# Patient Record
Sex: Male | Born: 1970 | Race: White | Hispanic: No | Marital: Married | Smoking: Current every day smoker
Health system: Southern US, Community
[De-identification: ages and names within clinical notes are randomized; demographics above are authoritative.]

## PROBLEM LIST (undated history)

## (undated) DIAGNOSIS — T8859XA Other complications of anesthesia, initial encounter: Secondary | ICD-10-CM

## (undated) DIAGNOSIS — T4145XA Adverse effect of unspecified anesthetic, initial encounter: Secondary | ICD-10-CM

## (undated) DIAGNOSIS — Z8719 Personal history of other diseases of the digestive system: Secondary | ICD-10-CM

## (undated) HISTORY — PX: ELBOW SURGERY: SHX618

## (undated) HISTORY — PX: SHOULDER SURGERY: SHX246

---

## 2014-09-07 ENCOUNTER — Other Ambulatory Visit: Payer: Self-pay | Admitting: Orthopedic Surgery

## 2014-09-14 NOTE — Pre-Procedure Instructions (Signed)
Shon HoughWilliam Durango  09/14/2014   Your procedure is scheduled on:  Thursday September 22, 2014 at 7:30 AM.  [facility] at 5:30 AM.  Call this number if you have problems the morning of surgery: [phone]   Remember:   Do not eat food or drink liquids after midnight.   Take these medicines the morning of surgery with A SIP OF WATER: NONE   Please stop taking any vitamins, Meloxicam, Ibuprofen, Advil, Motrin, Alleve, etc    Do not wear jewelry.  Do not wear lotions, powders, or cologne.   Men may shave face and neck.  Do not bring valuables to the hospital.  [facility] not responsible for any belongings or valuables.               Contacts, dentures or bridgework may not be worn into surgery.  Leave suitcase in the car. After surgery it may be brought to your room.  For patients admitted to the hospital, discharge time is determined by your treatment team.               Patients discharged the day of surgery will not be allowed to drive home.  Name and phone number of your driver:   Special Instructions: Shower using CHG soap the night before and the morning of your surgery   Please read over the following fact sheets that you were given: Pain Booklet, Coughing and Deep Breathing, Blood Transfusion Information, MRSA Information and Surgical Site Infection Prevention

## 2014-09-15 ENCOUNTER — Encounter (HOSPITAL_COMMUNITY)
Admission: RE | Admit: 2014-09-15 | Discharge: 2014-09-15 | Disposition: A | Discharge status: Home / Self Care | Payer: Worker's Compensation | Source: Ambulatory Visit | Attending: Orthopedic Surgery | Admitting: Orthopedic Surgery

## 2014-09-15 ENCOUNTER — Encounter (HOSPITAL_COMMUNITY): Payer: Self-pay

## 2014-09-15 ENCOUNTER — Ambulatory Visit (HOSPITAL_COMMUNITY)
Admission: RE | Admit: 2014-09-15 | Discharge: 2014-09-15 | Disposition: A | Discharge status: Home / Self Care | Payer: Worker's Compensation | Source: Ambulatory Visit | Attending: Orthopedic Surgery | Admitting: Orthopedic Surgery

## 2014-09-15 DIAGNOSIS — Z01818 Encounter for other preprocedural examination: Secondary | ICD-10-CM | POA: Insufficient documentation

## 2014-09-15 DIAGNOSIS — Z87891 Personal history of nicotine dependence: Secondary | ICD-10-CM | POA: Insufficient documentation

## 2014-09-15 DIAGNOSIS — Z01810 Encounter for preprocedural cardiovascular examination: Secondary | ICD-10-CM | POA: Diagnosis not present

## 2014-09-15 DIAGNOSIS — Z01812 Encounter for preprocedural laboratory examination: Secondary | ICD-10-CM | POA: Diagnosis present

## 2014-09-15 HISTORY — DX: Personal history of other diseases of the digestive system: Z87.19

## 2014-09-15 HISTORY — DX: Adverse effect of unspecified anesthetic, initial encounter: T41.45XA

## 2014-09-15 HISTORY — DX: Other complications of anesthesia, initial encounter: T88.59XA

## 2014-09-15 LAB — CBC WITH DIFFERENTIAL/PLATELET
BASOS ABS: 0 10*3/uL (ref 0.0–0.1)
BASOS PCT: 0 % (ref 0–1)
Eosinophils Absolute: 0.4 10*3/uL (ref 0.0–0.7)
Eosinophils Relative: 5 % (ref 0–5)
HCT: 45.8 % (ref 39.0–52.0)
Hemoglobin: 15.6 g/dL (ref 13.0–17.0)
Lymphocytes Relative: 25 % (ref 12–46)
Lymphs Abs: 1.7 10*3/uL (ref 0.7–4.0)
MCH: 32.5 pg (ref 26.0–34.0)
MCHC: 34.1 g/dL (ref 30.0–36.0)
MCV: 95.4 fL (ref 78.0–100.0)
Monocytes Absolute: 0.5 10*3/uL (ref 0.1–1.0)
Monocytes Relative: 8 % (ref 3–12)
NEUTROS PCT: 62 % (ref 43–77)
Neutro Abs: 4.2 10*3/uL (ref 1.7–7.7)
PLATELETS: 234 10*3/uL (ref 150–400)
RBC: 4.8 MIL/uL (ref 4.22–5.81)
RDW: 13.6 % (ref 11.5–15.5)
WBC: 6.7 10*3/uL (ref 4.0–10.5)

## 2014-09-15 LAB — ABO/RH: ABO/RH(D): A POS

## 2014-09-15 LAB — URINALYSIS, ROUTINE W REFLEX MICROSCOPIC
Bilirubin Urine: NEGATIVE
Glucose, UA: NEGATIVE mg/dL
Hgb urine dipstick: NEGATIVE
Ketones, ur: NEGATIVE mg/dL
LEUKOCYTES UA: NEGATIVE
Nitrite: NEGATIVE
Protein, ur: NEGATIVE mg/dL
Specific Gravity, Urine: 1.011 (ref 1.005–1.030)
UROBILINOGEN UA: 0.2 mg/dL (ref 0.0–1.0)
pH: 7.5 (ref 5.0–8.0)

## 2014-09-15 LAB — COMPREHENSIVE METABOLIC PANEL
ALT: 31 U/L (ref 0–53)
AST: 25 U/L (ref 0–37)
Albumin: 3.9 g/dL (ref 3.5–5.2)
Alkaline Phosphatase: 64 U/L (ref 39–117)
Anion gap: 9 (ref 5–15)
BUN: 11 mg/dL (ref 6–23)
CO2: 24 mmol/L (ref 19–32)
CREATININE: 0.79 mg/dL (ref 0.50–1.35)
Calcium: 9.3 mg/dL (ref 8.4–10.5)
Chloride: 104 mmol/L (ref 96–112)
Glucose, Bld: 117 mg/dL — ABNORMAL HIGH (ref 70–99)
Potassium: 4.4 mmol/L (ref 3.5–5.1)
Sodium: 137 mmol/L (ref 135–145)
TOTAL PROTEIN: 7.1 g/dL (ref 6.0–8.3)
Total Bilirubin: 0.8 mg/dL (ref 0.3–1.2)

## 2014-09-15 LAB — SURGICAL PCR SCREEN
MRSA, PCR: NEGATIVE
STAPHYLOCOCCUS AUREUS: NEGATIVE

## 2014-09-15 LAB — PROTIME-INR
INR: 0.94 (ref 0.00–1.49)
PROTHROMBIN TIME: 12.7 s (ref 11.6–15.2)

## 2014-09-15 LAB — APTT: APTT: 28 s (ref 24–37)

## 2014-09-15 LAB — TYPE AND SCREEN
ABO/RH(D): A POS
Antibody Screen: NEGATIVE

## 2014-09-15 NOTE — Progress Notes (Signed)
Patient informed Nurse that he did not have a PCP. Patient denied having any cardiac or pulmonary issues.   While reviewing anesthesia history patient informed Nurse that [place] Patient unaware of the name of place where those procedures were performed, but he gave Nurse the address "[address] Patient stated after he had the shoulder surgery he woke up very "angry" and it lasted for about two or three days. Will attempt to request records from [facility] [place].

## 2014-09-21 MED ORDER — POVIDONE-IODINE 7.5 % EX SOLN
Freq: Once | CUTANEOUS | Status: DC
  Filled 2014-09-21: qty 118

## 2014-09-21 MED ORDER — CEFAZOLIN SODIUM-DEXTROSE 2-3 GM-% IV SOLR
2.0000 g | INTRAVENOUS | Status: AC
  Administered 2014-09-22 (×2): 2 g via INTRAVENOUS
  Filled 2014-09-21: qty 50

## 2014-09-22 ENCOUNTER — Inpatient Hospital Stay (HOSPITAL_COMMUNITY): Payer: Worker's Compensation | Admitting: Certified Registered Nurse Anesthetist

## 2014-09-22 ENCOUNTER — Inpatient Hospital Stay (HOSPITAL_COMMUNITY)
Admission: RE | Admit: 2014-09-22 | Discharge: 2014-09-24 | DRG: 460 | Disposition: A | Discharge status: Home / Self Care | Payer: Worker's Compensation | Source: Ambulatory Visit | Attending: Orthopedic Surgery | Admitting: Orthopedic Surgery

## 2014-09-22 ENCOUNTER — Inpatient Hospital Stay (HOSPITAL_COMMUNITY): Payer: Worker's Compensation

## 2014-09-22 ENCOUNTER — Encounter (HOSPITAL_COMMUNITY)
Admission: RE | Disposition: A | Discharge status: Home / Self Care | Payer: Worker's Compensation | Source: Ambulatory Visit | Attending: Orthopedic Surgery

## 2014-09-22 ENCOUNTER — Other Ambulatory Visit: Payer: Self-pay

## 2014-09-22 ENCOUNTER — Encounter (HOSPITAL_COMMUNITY): Payer: Self-pay | Admitting: Surgery

## 2014-09-22 DIAGNOSIS — F17210 Nicotine dependence, cigarettes, uncomplicated: Secondary | ICD-10-CM | POA: Diagnosis not present

## 2014-09-22 DIAGNOSIS — M5116 Intervertebral disc disorders with radiculopathy, lumbar region: Principal | ICD-10-CM | POA: Diagnosis present

## 2014-09-22 DIAGNOSIS — M4806 Spinal stenosis, lumbar region: Secondary | ICD-10-CM | POA: Diagnosis present

## 2014-09-22 DIAGNOSIS — M5410 Radiculopathy, site unspecified: Secondary | ICD-10-CM | POA: Diagnosis present

## 2014-09-22 DIAGNOSIS — Z419 Encounter for procedure for purposes other than remedying health state, unspecified: Secondary | ICD-10-CM

## 2014-09-22 DIAGNOSIS — M549 Dorsalgia, unspecified: Secondary | ICD-10-CM | POA: Diagnosis present

## 2014-09-22 SURGERY — POSTERIOR LUMBAR FUSION 2 LEVEL
Anesthesia: General | Laterality: Left

## 2014-09-22 MED ORDER — LIDOCAINE HCL (CARDIAC) 20 MG/ML IV SOLN
INTRAVENOUS | Status: AC
  Filled 2014-09-22: qty 5

## 2014-09-22 MED ORDER — OXYCODONE-ACETAMINOPHEN 5-325 MG PO TABS
1.0000 | ORAL_TABLET | ORAL | Status: DC | PRN
  Administered 2014-09-22 – 2014-09-24 (×7): 2 via ORAL
  Filled 2014-09-22 (×7): qty 2

## 2014-09-22 MED ORDER — OXYCODONE HCL 5 MG PO TABS: 5.0000 mg | ORAL_TABLET | Freq: Once | ORAL | Status: DC | PRN

## 2014-09-22 MED ORDER — DIAZEPAM 5 MG PO TABS
5.0000 mg | ORAL_TABLET | Freq: Four times a day (QID) | ORAL | Status: DC | PRN
  Administered 2014-09-22 – 2014-09-24 (×5): 5 mg via ORAL
  Filled 2014-09-22 (×5): qty 1

## 2014-09-22 MED ORDER — BUPIVACAINE-EPINEPHRINE (PF) 0.25% -1:200000 IJ SOLN
INTRAMUSCULAR | Status: AC
  Filled 2014-09-22: qty 30

## 2014-09-22 MED ORDER — ACETAMINOPHEN 325 MG PO TABS: 650.0000 mg | ORAL_TABLET | ORAL | Status: DC | PRN

## 2014-09-22 MED ORDER — VECURONIUM BROMIDE 10 MG IV SOLR
INTRAVENOUS | Status: DC | PRN
  Administered 2014-09-22 (×3): 1 mg via INTRAVENOUS
  Administered 2014-09-22: 2 mg via INTRAVENOUS

## 2014-09-22 MED ORDER — GLYCOPYRROLATE 0.2 MG/ML IJ SOLN
INTRAMUSCULAR | Status: DC | PRN
  Administered 2014-09-22: .8 mg via INTRAVENOUS

## 2014-09-22 MED ORDER — PROPOFOL INFUSION 10 MG/ML OPTIME
INTRAVENOUS | Status: DC | PRN
  Administered 2014-09-22: 50 ug/kg/min via INTRAVENOUS

## 2014-09-22 MED ORDER — FENTANYL CITRATE 0.05 MG/ML IJ SOLN
INTRAMUSCULAR | Status: AC
  Filled 2014-09-22: qty 5

## 2014-09-22 MED ORDER — MIDAZOLAM HCL 2 MG/2ML IJ SOLN
INTRAMUSCULAR | Status: AC
  Filled 2014-09-22: qty 2

## 2014-09-22 MED ORDER — METHYLENE BLUE 1 % INJ SOLN
INTRAMUSCULAR | Status: AC
  Filled 2014-09-22: qty 10

## 2014-09-22 MED ORDER — SENNOSIDES-DOCUSATE SODIUM 8.6-50 MG PO TABS: 1.0000 | ORAL_TABLET | Freq: Every evening | ORAL | Status: DC | PRN

## 2014-09-22 MED ORDER — MORPHINE SULFATE 2 MG/ML IJ SOLN
1.0000 mg | INTRAMUSCULAR | Status: DC | PRN
  Administered 2014-09-22: 2 mg via INTRAVENOUS
  Administered 2014-09-23 (×3): 4 mg via INTRAVENOUS
  Filled 2014-09-22 (×3): qty 2
  Filled 2014-09-22: qty 1

## 2014-09-22 MED ORDER — ROCURONIUM BROMIDE 100 MG/10ML IV SOLN
INTRAVENOUS | Status: DC | PRN
  Administered 2014-09-22: 30 mg via INTRAVENOUS

## 2014-09-22 MED ORDER — SUCCINYLCHOLINE CHLORIDE 20 MG/ML IJ SOLN
INTRAMUSCULAR | Status: DC | PRN
  Administered 2014-09-22: 140 mg via INTRAVENOUS

## 2014-09-22 MED ORDER — MIDAZOLAM HCL 5 MG/5ML IJ SOLN
INTRAMUSCULAR | Status: DC | PRN
  Administered 2014-09-22: 2 mg via INTRAVENOUS

## 2014-09-22 MED ORDER — 0.9 % SODIUM CHLORIDE (POUR BTL) OPTIME
TOPICAL | Status: DC | PRN
  Administered 2014-09-22 (×3): 1000 mL

## 2014-09-22 MED ORDER — ONDANSETRON HCL 4 MG/2ML IJ SOLN
INTRAMUSCULAR | Status: DC | PRN
  Administered 2014-09-22: 4 mg via INTRAVENOUS

## 2014-09-22 MED ORDER — SODIUM CHLORIDE 0.9 % IV SOLN
INTRAVENOUS | Status: DC
  Administered 2014-09-22: 75 mL/h via INTRAVENOUS
  Administered 2014-09-23: 04:00:00 via INTRAVENOUS

## 2014-09-22 MED ORDER — SODIUM CHLORIDE 0.9 % IJ SOLN
INTRAMUSCULAR | Status: AC
  Filled 2014-09-22: qty 3

## 2014-09-22 MED ORDER — DOCUSATE SODIUM 100 MG PO CAPS
100.0000 mg | ORAL_CAPSULE | Freq: Two times a day (BID) | ORAL | Status: DC
  Administered 2014-09-22 – 2014-09-23 (×3): 100 mg via ORAL
  Filled 2014-09-22 (×2): qty 1

## 2014-09-22 MED ORDER — BISACODYL 5 MG PO TBEC: 5.0000 mg | DELAYED_RELEASE_TABLET | Freq: Every day | ORAL | Status: DC | PRN

## 2014-09-22 MED ORDER — METHYLPREDNISOLONE ACETATE 40 MG/ML IJ SUSP
INTRAMUSCULAR | Status: AC
  Filled 2014-09-22: qty 2

## 2014-09-22 MED ORDER — SODIUM CHLORIDE 0.9 % IJ SOLN
3.0000 mL | Freq: Two times a day (BID) | INTRAMUSCULAR | Status: DC
  Administered 2014-09-22 – 2014-09-23 (×2): 3 mL via INTRAVENOUS

## 2014-09-22 MED ORDER — LIDOCAINE HCL (CARDIAC) 20 MG/ML IV SOLN
INTRAVENOUS | Status: DC | PRN
  Administered 2014-09-22: 40 mg via INTRAVENOUS

## 2014-09-22 MED ORDER — MENTHOL 3 MG MT LOZG: 1.0000 | LOZENGE | OROMUCOSAL | Status: DC | PRN

## 2014-09-22 MED ORDER — PHENOL 1.4 % MT LIQD
1.0000 | OROMUCOSAL | Status: DC | PRN
Start: 2014-09-22 — End: 2014-09-24

## 2014-09-22 MED ORDER — THROMBIN 20000 UNITS EX SOLR
CUTANEOUS | Status: AC
  Filled 2014-09-22: qty 20000

## 2014-09-22 MED ORDER — PROPOFOL 10 MG/ML IV BOLUS
INTRAVENOUS | Status: AC
  Filled 2014-09-22: qty 20

## 2014-09-22 MED ORDER — SODIUM CHLORIDE 0.9 % IV SOLN
250.0000 mL | INTRAVENOUS | Status: DC
  Administered 2014-09-22: 250 mL via INTRAVENOUS

## 2014-09-22 MED ORDER — ONDANSETRON HCL 4 MG/2ML IJ SOLN
INTRAMUSCULAR | Status: AC
  Filled 2014-09-22: qty 2

## 2014-09-22 MED ORDER — CEFAZOLIN SODIUM 1-5 GM-% IV SOLN
1.0000 g | Freq: Three times a day (TID) | INTRAVENOUS | Status: AC
  Administered 2014-09-22 – 2014-09-23 (×2): 1 g via INTRAVENOUS
  Filled 2014-09-22 (×2): qty 50

## 2014-09-22 MED ORDER — ACETAMINOPHEN 650 MG RE SUPP: 650.0000 mg | RECTAL | Status: DC | PRN

## 2014-09-22 MED ORDER — ARTIFICIAL TEARS OP OINT
TOPICAL_OINTMENT | OPHTHALMIC | Status: DC | PRN
Start: 2014-09-22 — End: 2014-09-22
  Administered 2014-09-22: 1 via OPHTHALMIC

## 2014-09-22 MED ORDER — ZOLPIDEM TARTRATE 5 MG PO TABS: 5.0000 mg | ORAL_TABLET | Freq: Every evening | ORAL | Status: DC | PRN

## 2014-09-22 MED ORDER — METHYLENE BLUE 1 % INJ SOLN
INTRAMUSCULAR | Status: DC | PRN
  Administered 2014-09-22: .5 mL

## 2014-09-22 MED ORDER — SODIUM CHLORIDE 0.9 % IJ SOLN: 3.0000 mL | INTRAMUSCULAR | Status: DC | PRN

## 2014-09-22 MED ORDER — LACTATED RINGERS IV SOLN
INTRAVENOUS | Status: DC | PRN
  Administered 2014-09-22 (×3): via INTRAVENOUS

## 2014-09-22 MED ORDER — ONDANSETRON HCL 4 MG/2ML IJ SOLN
4.0000 mg | Freq: Once | INTRAMUSCULAR | Status: DC | PRN
Start: 2014-09-22 — End: 2014-09-22

## 2014-09-22 MED ORDER — OXYCODONE HCL 5 MG/5ML PO SOLN: 5.0000 mg | Freq: Once | ORAL | Status: DC | PRN

## 2014-09-22 MED ORDER — ONDANSETRON HCL 4 MG/2ML IJ SOLN: 4.0000 mg | INTRAMUSCULAR | Status: DC | PRN

## 2014-09-22 MED ORDER — BUPIVACAINE-EPINEPHRINE 0.25% -1:200000 IJ SOLN
INTRAMUSCULAR | Status: DC | PRN
  Administered 2014-09-22: 26 mL

## 2014-09-22 MED ORDER — FLEET ENEMA 7-19 GM/118ML RE ENEM: 1.0000 | ENEMA | Freq: Once | RECTAL | Status: AC | PRN

## 2014-09-22 MED ORDER — LIDOCAINE HCL 4 % MT SOLN
OROMUCOSAL | Status: DC | PRN
  Administered 2014-09-22: 4 mL via TOPICAL

## 2014-09-22 MED ORDER — ALUM & MAG HYDROXIDE-SIMETH 200-200-20 MG/5ML PO SUSP: 30.0000 mL | Freq: Four times a day (QID) | ORAL | Status: DC | PRN

## 2014-09-22 MED ORDER — GELATIN ABSORBABLE 100 EX MISC
CUTANEOUS | Status: DC | PRN
  Administered 2014-09-22: 20 mL via TOPICAL

## 2014-09-22 MED ORDER — HYDROMORPHONE HCL 1 MG/ML IJ SOLN: 0.2500 mg | INTRAMUSCULAR | Status: DC | PRN

## 2014-09-22 MED ORDER — KETOROLAC TROMETHAMINE 0.5 % OP SOLN
1.0000 [drp] | Freq: Four times a day (QID) | OPHTHALMIC | Status: DC
Start: 2014-09-22 — End: 2014-09-24
  Administered 2014-09-22 – 2014-09-23 (×7): 1 [drp] via OPHTHALMIC
  Filled 2014-09-22: qty 3

## 2014-09-22 MED ORDER — FENTANYL CITRATE 0.05 MG/ML IJ SOLN
INTRAMUSCULAR | Status: DC | PRN
  Administered 2014-09-22 (×3): 50 ug via INTRAVENOUS
  Administered 2014-09-22: 100 ug via INTRAVENOUS
  Administered 2014-09-22 (×7): 50 ug via INTRAVENOUS

## 2014-09-22 MED ORDER — NEOSTIGMINE METHYLSULFATE 10 MG/10ML IV SOLN
INTRAVENOUS | Status: DC | PRN
  Administered 2014-09-22: 5 mg via INTRAVENOUS

## 2014-09-22 MED ORDER — PROPOFOL 10 MG/ML IV BOLUS
INTRAVENOUS | Status: DC | PRN
  Administered 2014-09-22: 250 mg via INTRAVENOUS

## 2014-09-22 SURGICAL SUPPLY — 86 items
BENZOIN TINCTURE PRP APPL 2/3 (GAUZE/BANDAGES/DRESSINGS) ×2 IMPLANT
BLADE SURG ROTATE 9660 (MISCELLANEOUS) IMPLANT
BUR PRESCISION 1.7 ELITE (BURR) ×2 IMPLANT
BUR ROUND PRECISION 4.0 (BURR) ×2 IMPLANT
CAGE BULLET CONCORDE 9X9X27 (Cage) ×2 IMPLANT
CAGE CONCORDE BULLET 9X7X27 (Cage) ×2 IMPLANT
CARTRIDGE OIL MAESTRO DRILL (MISCELLANEOUS) ×1 IMPLANT
CONT SPEC STER OR (MISCELLANEOUS) IMPLANT
COVER SURGICAL LIGHT HANDLE (MISCELLANEOUS) ×2 IMPLANT
DIFFUSER DRILL AIR PNEUMATIC (MISCELLANEOUS) ×2 IMPLANT
DRAIN CHANNEL 15F RND FF W/TCR (WOUND CARE) IMPLANT
DRAPE C-ARM 42X72 X-RAY (DRAPES) ×2 IMPLANT
DRAPE C-ARMOR (DRAPES) ×2 IMPLANT
DRAPE ORTHO SPLIT 77X108 STRL (DRAPES)
DRAPE POUCH INSTRU U-SHP 10X18 (DRAPES) IMPLANT
DRAPE SURG 17X23 STRL (DRAPES) ×8 IMPLANT
DRAPE SURG ORHT 6 SPLT 77X108 (DRAPES) IMPLANT
DRSG MEPILEX BORDER 4X12 (GAUZE/BANDAGES/DRESSINGS) IMPLANT
DRSG MEPILEX BORDER 4X8 (GAUZE/BANDAGES/DRESSINGS) IMPLANT
DURAPREP 26ML APPLICATOR (WOUND CARE) ×2 IMPLANT
ELECT BLADE 4.0 EZ CLEAN MEGAD (MISCELLANEOUS) ×2
ELECT CAUTERY BLADE 6.4 (BLADE) ×4 IMPLANT
ELECT REM PT RETURN 9FT ADLT (ELECTROSURGICAL) ×2
ELECTRODE BLDE 4.0 EZ CLN MEGD (MISCELLANEOUS) ×1 IMPLANT
ELECTRODE REM PT RTRN 9FT ADLT (ELECTROSURGICAL) ×1 IMPLANT
EVACUATOR SILICONE 100CC (DRAIN) IMPLANT
GAUZE SPONGE 4X4 12PLY STRL (GAUZE/BANDAGES/DRESSINGS) ×2 IMPLANT
GAUZE SPONGE 4X4 16PLY XRAY LF (GAUZE/BANDAGES/DRESSINGS) ×2 IMPLANT
GLOVE BIO SURGEON STRL SZ7 (GLOVE) ×2 IMPLANT
GLOVE BIO SURGEON STRL SZ8 (GLOVE) ×2 IMPLANT
GLOVE BIOGEL PI IND STRL 7.0 (GLOVE) ×2 IMPLANT
GLOVE BIOGEL PI IND STRL 8 (GLOVE) ×1 IMPLANT
GLOVE BIOGEL PI INDICATOR 7.0 (GLOVE) ×2
GLOVE BIOGEL PI INDICATOR 8 (GLOVE) ×1
GLOVE SURG SS PI 7.0 STRL IVOR (GLOVE) ×4 IMPLANT
GOWN STRL REUS W/ TWL LRG LVL3 (GOWN DISPOSABLE) ×3 IMPLANT
GOWN STRL REUS W/ TWL XL LVL3 (GOWN DISPOSABLE) ×1 IMPLANT
GOWN STRL REUS W/TWL LRG LVL3 (GOWN DISPOSABLE) ×3
GOWN STRL REUS W/TWL XL LVL3 (GOWN DISPOSABLE) ×1
IV CATH 14GX2 1/4 (CATHETERS) ×2 IMPLANT
KIT BASIN OR (CUSTOM PROCEDURE TRAY) ×2 IMPLANT
KIT POSITION SURG JACKSON T1 (MISCELLANEOUS) ×2 IMPLANT
KIT ROOM TURNOVER OR (KITS) ×2 IMPLANT
MARKER SKIN DUAL TIP RULER LAB (MISCELLANEOUS) ×2 IMPLANT
MIX DBX 20CC MTF (Putty) ×2 IMPLANT
NEEDLE BONE MARROW 8GX6 FENEST (NEEDLE) IMPLANT
NEEDLE HYPO 25GX1X1/2 BEV (NEEDLE) ×2 IMPLANT
NEEDLE SPNL 18GX3.5 QUINCKE PK (NEEDLE) ×4 IMPLANT
NEURO MONITORING STIM (LABOR (TRAVEL & OVERTIME)) ×2 IMPLANT
NS IRRIG 1000ML POUR BTL (IV SOLUTION) ×4 IMPLANT
OIL CARTRIDGE MAESTRO DRILL (MISCELLANEOUS) ×2
PACK LAMINECTOMY ORTHO (CUSTOM PROCEDURE TRAY) ×2 IMPLANT
PACK UNIVERSAL I (CUSTOM PROCEDURE TRAY) ×2 IMPLANT
PAD ARMBOARD 7.5X6 YLW CONV (MISCELLANEOUS) ×4 IMPLANT
PATTIES SURGICAL .5 X1 (DISPOSABLE) ×2 IMPLANT
PATTIES SURGICAL .5 X3 (DISPOSABLE) IMPLANT
PATTIES SURGICAL .5X1.5 (GAUZE/BANDAGES/DRESSINGS) IMPLANT
PATTIES SURGICAL .75X.75 (GAUZE/BANDAGES/DRESSINGS) IMPLANT
ROD EXPEDIUM PRE BENT 5.5X75 (Rod) ×4 IMPLANT
SCREW SET SINGLE INNER (Screw) ×12 IMPLANT
SCREW VIPER CORT FIX 6X35 (Screw) ×2 IMPLANT
SCREW VIPER CORTICAL FIX 6X40 (Screw) ×8 IMPLANT
SCREW VIPER CORTICAL FIX 6X45 (Screw) ×4 IMPLANT
SPONGE INTESTINAL PEANUT (DISPOSABLE) ×2 IMPLANT
SPONGE LAP 4X18 X RAY DECT (DISPOSABLE) ×4 IMPLANT
SPONGE SURGIFOAM ABS GEL 100 (HEMOSTASIS) ×2 IMPLANT
STRIP CLOSURE SKIN 1/2X4 (GAUZE/BANDAGES/DRESSINGS) ×2 IMPLANT
SURGIFLO TRUKIT (HEMOSTASIS) IMPLANT
SUT BONE WAX W31G (SUTURE) ×4 IMPLANT
SUT MNCRL AB 4-0 PS2 18 (SUTURE) ×2 IMPLANT
SUT PROLENE 6 0 C 1 24 (SUTURE) IMPLANT
SUT VIC AB 0 CT1 18XCR BRD 8 (SUTURE) ×1 IMPLANT
SUT VIC AB 0 CT1 8-18 (SUTURE) ×1
SUT VIC AB 1 CT1 18XCR BRD 8 (SUTURE) ×1 IMPLANT
SUT VIC AB 1 CT1 8-18 (SUTURE) ×1
SUT VIC AB 2-0 CT2 18 VCP726D (SUTURE) ×4 IMPLANT
SYR 20CC LL (SYRINGE) ×2 IMPLANT
SYR BULB IRRIGATION 50ML (SYRINGE) ×4 IMPLANT
SYR CONTROL 10ML LL (SYRINGE) ×4 IMPLANT
SYR TB 1ML LUER SLIP (SYRINGE) ×2 IMPLANT
TAP EXPEDIUM DL 3.35 (TAP) ×2 IMPLANT
TOWEL OR 17X24 6PK STRL BLUE (TOWEL DISPOSABLE) ×4 IMPLANT
TOWEL OR 17X26 10 PK STRL BLUE (TOWEL DISPOSABLE) ×2 IMPLANT
TRAY FOLEY CATH 16FRSI W/METER (SET/KITS/TRAYS/PACK) ×2 IMPLANT
WATER STERILE IRR 1000ML POUR (IV SOLUTION) ×4 IMPLANT
YANKAUER SUCT BULB TIP NO VENT (SUCTIONS) ×2 IMPLANT

## 2014-09-22 NOTE — OR Nursing (Signed)
Family updated about status of patient through [employer].

## 2014-09-22 NOTE — Anesthesia Postprocedure Evaluation (Signed)
  Anesthesia Post-op Note  Patient: Shon HoughWilliam Winship  Procedure(s) Performed: Procedure(s) with comments: POSTERIOR LUMBAR FUSION 2 LEVEL (Left) - Left sided lumbar 4-5, lumbar 5-sacrum 1 Transforaminal lumbar interbody fusion with instrumentation and allograft  Patient Location: PACU  Anesthesia Type: General   Level of Consciousness: awake, alert  and oriented  Airway and Oxygen Therapy: Patient Spontanous Breathing  Post-op Pain: mild  Post-op Assessment: Post-op Vital signs reviewed  Post-op Vital Signs: Reviewed  Last Vitals:  Filed Vitals:   09/22/14 1656  BP: 143/77  Pulse: 65  Temp: 37.2 C  Resp: 14    Complications: No apparent anesthesia complications

## 2014-09-22 NOTE — Anesthesia Procedure Notes (Signed)
Procedure Name: Intubation Date/Time: 09/22/2014 7:37 AM Performed by: Dairl PonderJIANG, Khyan Oats Pre-anesthesia Checklist: Patient identified, Timeout performed, Emergency Drugs available, Suction available and Patient being monitored Patient Re-evaluated:Patient Re-evaluated prior to inductionOxygen Delivery Method: Circle system utilized Preoxygenation: Pre-oxygenation with 100% oxygen Intubation Type: IV induction Laryngoscope Size: 2 and 4 Grade View: Grade I Tube type: Oral Tube size: 7.5 mm Number of attempts: 1 Placement Confirmation: ETT inserted through vocal cords under direct vision,  breath sounds checked- equal and bilateral and positive ETCO2 Secured at: 23 cm Tube secured with: Tape Dental Injury: Teeth and Oropharynx as per pre-operative assessment

## 2014-09-22 NOTE — Anesthesia Preprocedure Evaluation (Addendum)
Anesthesia Evaluation  Patient identified by MRN, date of birth, ID band Patient awake    Reviewed: Allergy & Precautions, NPO status , Patient's Chart, lab work & pertinent test results  Airway Mallampati: II  TM Distance: >3 FB Neck ROM: Full    Dental  (+) Teeth Intact, Dental Advisory Given   Pulmonary Current Smoker,  breath sounds clear to auscultation        Cardiovascular negative cardio ROS  Rhythm:Regular Rate:Normal     Neuro/Psych    GI/Hepatic   Endo/Other    Renal/GU      Musculoskeletal   Abdominal   Peds  Hematology   Anesthesia Other Findings   Reproductive/Obstetrics                            Anesthesia Physical Anesthesia Plan  ASA: II  Anesthesia Plan: General   Post-op Pain Management:    Induction: Intravenous  Airway Management Planned: Oral ETT  Additional Equipment:   Intra-op Plan:   Post-operative Plan: Extubation in OR  Informed Consent:   Dental advisory given  Plan Discussed with: Anesthesiologist, Surgeon and CRNA  Anesthesia Plan Comments:        Anesthesia Quick Evaluation

## 2014-09-22 NOTE — Op Note (Signed)
NAME:  Ruben Atkinson, Arish             ACCOUNT NO.:  [id number]  MEDICAL RECORD NO.:  [id number]  LOCATION:  [id number]                        FACILITY:  [facility]  PHYSICIAN:  Estill BambergMark Emerson Schreifels, MD      DATE OF BIRTH:  [birth year]  DATE OF PROCEDURE:  09/22/2014                              OPERATIVE REPORT   PREOPERATIVE DIAGNOSES: 1. Severe L4-5, L5-S1 degenerative disc disease. 2. L4-5 spinal stenosis. 3. Axial low back pain and bilateral groin pain.  POSTOPERATIVE DIAGNOSES: 1. Severe L4-5, L5-S1 degenerative disc disease. 2. L4-5 spinal stenosis. 3. Axial low back pain and bilateral groin pain.  PROCEDURE: 1. Left-sided L4-5, L5-S1 transforaminal lumbar interbody fusion. 2. Right-sided L4-5, L5-S1 posterolateral fusion. 3. L4-5 decompression with bilateral partial facetectomy. 4. Placement of posterior segmental instrumentation L4, L5, S1 (6-mm     cortical pedicle screws). 5. Insertion of interbody device x2 (Concorde bullet cages). 6. Use of local autograft. 7. Use of morselized allograft-DBX mix. 8. Intraoperative use of fluoroscopy.  SURGEON:  Estill BambergMark Woodroe Vogan, MD  ASSISTANTS:  Jason CoopKayla McKenzie, PA-C  ANESTHESIA:  General endotracheal anesthesia.  COMPLICATIONS:  None.  DISPOSITION:  Stable.  ESTIMATED BLOOD LOSS:  500 mL with 200 mL of blood re-transfused.  INDICATIONS FOR SURGERY:  Briefly, Ruben Atkinson is a very pleasant 44- year-old male who was injured at work, and went on to have rather debilitating pain in his low back and legs.  The patient did fail multiple forms of conservative care.  Of note, an MRI did reveal a very substantial and profound L4-5 and L5-S1 degenerative disc disease. There was also noted to be spinal stenosis at L4-5.  Again, the patient did fail multiple forms of conservative care.  A spondylolisthesis was also noted at L4-5.  Given the patient's ongoing pain, we did discuss proceeding with the procedure noted above.  The patient did fully understand  the risks and limitations of the procedure.  Of particular note, the patient is a smoker, and he did understand that this smoking does increase the risk of a nonunion in one or more levels of his fusion.  He did also understand that the surgery noted above may or may not address his axial low back complaints.  He did elect to proceed.  OPERATIVE DETAILS:  On September 22, 2014, the patient was brought to surgery and general endotracheal anesthesia was administered.  The patient was placed prone on a well-padded flat Jackson bed with a spinal frame.  Antibiotics were given and a time-out procedure was performed. The back was prepped and draped.  I then made a midline incision overlying the L4-S1 region.  The fascia was incised at the midline.  The lamina of L4, L5, and S1 were subperiosteally exposed.  Then, using AP and lateral fluoroscopy, I did identify the appropriate starting points for cortical pedicle screws at L4-L5 and at S1.  A 1.7-mm bur was used to prepare the entry point, I then sequentially tapped up to a 6-mm tap bilaterally at L4, L5, as well as S1.  I did use a ball-tipped probe to confirm that there was no cortical violation of the cannulated pedicles. I was very pleased with the press fit of each of  the taps.  On the right side, there was prominent facet hypertrophy noted at L4-5 and at L5-S1. This was removed liberally using a rongeur.  I then decorticated the posterior elements in the posterolateral gutter on the right.  I then placed cortical screws of the appropriate length, 6 mm in diameter, at L4, L5, as well as S1.  A rod was secured across the heads of the screws and distraction was applied across the rod.  Caps were placed and provisionally tightened.  I then turned my attention towards the left side.  I then performed an L4-5 decompression with a bilateral partial facetectomy.  Doing so, I was able to adequately decompress the spinal canal and compress the lateral  recesses.  Then, starting at the L5-S1 level, I did perform a full facetectomy, and I performed a full intervertebral diskectomy as well to the left side, with an assistant holding medial retraction of the traversing S1 nerve.  Once the disk was entirely removed, the endplates were prepared and the intervertebral space was then packed with autograft as well as allograft in the form of DBX mix.  The appropriate size interbody spacer, 27 mm in length, was also packed with autograft and allograft and tamped into position in the usual fashion.  I was very pleased with the press fit of the implant. Then, at the L4-5 level, a full facetectomy was performed, with an assistant holding medial retraction at the L5 nerve.  I did again perform a full intervertebral diskectomy, entering the left posterolateral aspect of the disc.  Once the endplates were prepared, the intervertebral space was packed with autograft and allograft, as was the appropriate size intervertebral spacer, which was tamped into position in the usual fashion.  Again, I was very pleased with the press fit of the intervertebral implants.  I then placed pedicle screws at L4, L5, and at S1 on the left.  I did test each of the screws using triggered EMG on the left, and there was no screw that tested below 15 milliamps.  An appropriate length rod was then secured into the two peds of the screws.  Caps were then placed.  The distraction was discontinued on the right contralateral side.  The caps were then provisionally tightened and a final locking procedure was performed.  I did copiously irrigate the wound throughout the surgery with approximately 3 L of normal saline in total.  I was very pleased with the final AP and lateral fluoroscopic images.  The wound was then closed in layers using #1 Vicryl, followed by 2-0 Vicryl, followed by 3-0 Monocryl.  Benzoin and Steri-Strips were applied, followed by sterile dressing.  All instrument  counts were correct at the termination of the procedure.  Of note, there was no sustained abnormal EMG activity noted throughout the surgery.  Also of note, Jason Coop was my assistant throughout surgery, and did aid in retraction, suctioning, and closure.     Estill Bamberg, MD     MD/MEDQ  D:  09/22/2014  T:  09/22/2014  Job:  [id number]

## 2014-09-22 NOTE — H&P (Signed)
     PREOPERATIVE H&P  Chief Complaint: Low back pain  HPI: Ruben Atkinson is a 44 y.o. male who presents with ongoing pain in the low back and bilateral legs  MRI reveals DDD L4/5 and L5/S1 and stenosis at L4/5  Patient has failed multiple forms of conservative care and continues to have pain (see office notes for additional details regarding the patient's full course of treatment)  Past Medical History  Diagnosis Date  . Complication of anesthesia     After having shoulder surgery patient stated "I was mad as soon as I woke up, lasted about 2-3 days"  . History of hiatal hernia    Past Surgical History  Procedure Laterality Date  . Elbow surgery Right   . Shoulder surgery Right    History   Social History  . Marital Status: Married    Spouse Name: N/A  . Number of Children: N/A  . Years of Education: N/A   Social History Main Topics  . Smoking status: Current Every Day Smoker -- 1.00 packs/day for 27 years  . Smokeless tobacco: Not on file  . Alcohol Use: 3.6 oz/week    6 Cans of beer per week     Comment: 3x week  . Drug Use: No  . Sexual Activity: Not on file   Other Topics Concern  . Not on file   Social History Narrative  . No narrative on file   No family history on file. No Known Allergies Prior to Admission medications   Medication Sig Start Date End Date Taking? Authorizing Provider  Ascorbic Acid (VITAMIN C PO) Take 1 tablet by mouth 2 (two) times daily.   Yes Historical Provider, MD  B Complex Vitamins (VITAMIN B COMPLEX PO) Take 1 tablet by mouth daily.   Yes Historical Provider, MD  DHEA 25 MG CAPS Take 25 mg by mouth every morning.   Yes Historical Provider, MD  L-Arginine 500 MG CAPS Take 1 capsule by mouth daily.   Yes Historical Provider, MD  Multiple Minerals (CHELATED MULTIPLE MINERAL PO) Take 1 tablet by mouth 2 (two) times daily.   Yes Historical Provider, MD  OVER THE COUNTER MEDICATION Take 1 tablet by mouth 2 (two) times daily.  "Tribulus Vitamin"   Yes Historical Provider, MD     All other systems have been reviewed and were otherwise negative with the exception of those mentioned in the HPI and as above.  Physical Exam: There were no vitals filed for this visit.  General: Alert, no acute distress Cardiovascular: No pedal edema Respiratory: No cyanosis, no use of accessory musculature Skin: No lesions in the area of chief complaint Neurologic: Sensation intact distally Psychiatric: Patient is competent for consent with normal mood and affect Lymphatic: No axillary or cervical lymphadenopathy   Assessment/Plan: Degenerative disc disease, spinal stenosis Plan for Procedure(s): POSTERIOR LUMBAR DECOMPRESSION AND FUSION 2 LEVELS   Emilee HeroUMONSKI,Chiyo Fay LEONARD, MD 09/22/2014 5:55 AM

## 2014-09-22 NOTE — Transfer of Care (Signed)
Immediate Anesthesia Transfer of Care Note  Patient: Ruben Atkinson  Procedure(s) Performed: Procedure(s) with comments: POSTERIOR LUMBAR FUSION 2 LEVEL (Left) - Left sided lumbar 4-5, lumbar 5-sacrum 1 Transforaminal lumbar interbody fusion with instrumentation and allograft  Patient Location: PACU  Anesthesia Type:General  Level of Consciousness: awake, alert  and oriented  Airway & Oxygen Therapy: Patient Spontanous Breathing and Patient connected to face mask oxygen  Post-op Assessment: Report given to RN and Post -op Vital signs reviewed and stable  Post vital signs: Reviewed and stable  Last Vitals:  Filed Vitals:   09/22/14 1445  BP:   Pulse:   Temp: 36.4 C  Resp:     Complications: No apparent anesthesia complications

## 2014-09-22 NOTE — Progress Notes (Signed)
Utilization review completed.  

## 2014-09-23 MED ORDER — KETOROLAC TROMETHAMINE 0.5 % OP SOLN
1.0000 [drp] | Freq: Three times a day (TID) | OPHTHALMIC | Status: DC | PRN
  Filled 2014-09-23: qty 3

## 2014-09-23 MED ORDER — POLYMYXIN B-TRIMETHOPRIM 10000-0.1 UNIT/ML-% OP SOLN
1.0000 [drp] | Freq: Three times a day (TID) | OPHTHALMIC | Status: DC
  Administered 2014-09-23 – 2014-09-24 (×2): 1 [drp] via OPHTHALMIC
  Filled 2014-09-23 (×2): qty 10

## 2014-09-23 MED ORDER — BSS IO SOLN
15.0000 mL | Freq: Once | INTRAOCULAR | Status: AC
  Administered 2014-09-23: 15 mL
  Filled 2014-09-23: qty 15

## 2014-09-23 MED FILL — Sodium Chloride IV Soln 0.9%: INTRAVENOUS | Qty: 1000 | Status: AC

## 2014-09-23 MED FILL — Thrombin For Soln 20000 Unit: CUTANEOUS | Qty: 1 | Status: AC

## 2014-09-23 MED FILL — Sodium Chloride Irrigation Soln 0.9%: Qty: 3000 | Status: AC

## 2014-09-23 MED FILL — Heparin Sodium (Porcine) Inj 1000 Unit/ML: INTRAMUSCULAR | Qty: 30 | Status: AC

## 2014-09-23 NOTE — Progress Notes (Signed)
    Patient doing well, reports back stiffness and pain as expected, has not been up yet, still notes some pain in L ant thigh to the knee, intermittent. Good appettite, eating and drinking w/o problem. Some L eye discomfort, has been receiving drops    Physical Exam: BP 114/62 mmHg  Pulse 88  Temp(Src) 98.6 F (37 C) (Oral)  Resp 15  SpO2 99%  Dressing in place, CDI, SCD's in place, pt sitting up in bed just finished breakfast, TLSO is at bedside NVI  POD #1 s/p L4-S1 fusion  - up with PT/OT, encourage ambulation  -TLSO at all times when OOB except PM trips to bathroom  -Back precautions  - Percocet for pain, Valium for muscle spasms - likely d/c home tonight vs tomorrow pending PT/Px control

## 2014-09-23 NOTE — Evaluation (Signed)
Occupational Therapy Evaluation Patient Details Name: Ruben HoughWilliam Hollomon MRN: [id number] DOB: 03/24/1971 Today's Date: 09/23/2014    History of Present Illness 44 y.o. male s/p Left-sided L4-5, L5-S1 transforaminal lumbar interbody fusion   Clinical Impression   Pt currently supervision level for toilet transfers and sit to stand with LB selfcare.  He will continue to need mod to max assist for donning TLSO in sit to stand. Pt is aware of AE and DME available to help independence while maintaining back precautions.  He will have 24 hour supervision/assist at discharge from wife and feel he does not warrant any further OT needs at this time.  He already has a reacher, BSC, RW, and shower seat.  Recommended toilet aide which his wife can purchase if she chooses after discharge.  No further OT needs.    Follow Up Recommendations  No OT follow up    Equipment Recommendations  None recommended by OT       Precautions / Restrictions Precautions Precautions: Back Required Braces or Orthoses: Spinal Brace Spinal Brace: Thoracolumbosacral orthotic;Applied in sitting position;Applied in standing position Restrictions Weight Bearing Restrictions: No      Mobility Bed Mobility                  Transfers Overall transfer level: Needs assistance Equipment used: Rolling walker (2 wheeled)   Sit to Stand: Supervision              Balance     Sitting balance-Leahy Scale: Fair       Standing balance-Leahy Scale: Fair                              ADL Overall ADL's : Needs assistance/impaired Eating/Feeding: Independent   Grooming: Wash/dry hands;Wash/dry face;Standing;Supervision/safety   Upper Body Bathing: Supervision/ safety;Sitting   Lower Body Bathing: Minimal assistance;Sit to/from stand   Upper Body Dressing : Supervision/safety;Sitting Upper Body Dressing Details (indicate cue type and reason): max assist for TLSO with hip component Lower Body  Dressing: Minimal assistance;Adhering to back precautions;Sit to/from stand Lower Body Dressing Details (indicate cue type and reason): utilized sock aide and reacher for donning gripper socks Toilet Transfer: Supervision/safety;RW;Comfort height toilet Toilet Transfer Details (indicate cue type and reason): pt stood to urinate Toileting- ArchitectClothing Manipulation and Hygiene: Supervision/safety;Sit to/from stand   Tub/ Shower Transfer: Supervision/safety;Ambulation;Rolling walker Tub/Shower Transfer Details (indicate cue type and reason): simulated Functional mobility during ADLs: Supervision/safety General ADL Comments: Pt will need mod to max assist for donning TLSO in sit to stand.  He reports wife will be available to first week 24 hours to assist.  He already has a Sports administratorreacher for use with LB selfcare.  Also educated and let him practice with the sockaide.  He reports that he will likely not wear socks.  Demonstrated understanding of available AE for toilet hygiene as well.  He reports having a BSC, walker, and shower seat already.  This therapist recommended use of the [facility] in the shower as it has arm rests that will make sit to stand easier at home.      Vision Vision Assessment?: No apparent visual deficits   Perception Perception Perception Tested?: No   Praxis Praxis Praxis tested?: Within functional limits          Hand Dominance Right   Extremity/Trunk Assessment Upper Extremity Assessment Upper Extremity Assessment: Overall WFL for tasks assessed (Not formallly tested secondary to back precautions but WFLs for all  functional tasks)           Communication Communication Communication: No difficulties   Cognition Arousal/Alertness: Awake/alert Behavior During Therapy: WFL for tasks assessed/performed Overall Cognitive Status: Within Functional Limits for tasks assessed                                Home Living Family/patient expects to be discharged to::  Private residence Living Arrangements: Spouse/significant other Available Help at Discharge: Family;Available 24 hours/day Type of Home: House Home Access: Stairs to enter [employer] of Steps: 7 Entrance Stairs-Rails: Right Home Layout: One level     Bathroom Shower/Tub: Producer, television/film/video: Standard     Home Equipment: Environmental consultant - 2 wheels;Bedside commode;Shower seat          Prior Functioning/Environment Level of Independence: Independent                                       End of Session Equipment Utilized During Treatment: Back brace;Rolling walker Nurse Communication: Mobility status  Activity Tolerance: Patient tolerated treatment well Patient left: in chair;with call bell/phone within reach   Time: 1610-9604 OT Time Calculation (min): 48 min Charges:  OT General Charges $OT Visit: 1 Procedure OT Evaluation $Initial OT Evaluation Tier I: 1 Procedure OT Treatments $Self Care/Home Management : 38-52 mins  Onyx Schirmer OTR/L 09/23/2014, 12:57 PM

## 2014-09-23 NOTE — Progress Notes (Signed)
CARE MANAGEMENT NOTE 09/23/2014  Patient:  Ruben Atkinson,Ruben Atkinson   Account Number:  [id number]  Date Initiated:  09/23/2014  Documentation initiated by:  [facility],Jacolyn Joaquin  Subjective/Objective Assessment:   L4-L5, L5-S1 TLIF     Action/Plan:   PT/OT evals- no follow up or equipment recommended   Anticipated DC Date:  09/24/2014   Anticipated DC Plan:  HOME/SELF CARE      DC Planning Services  CM consult      Choice offered to / List presented to:             Status of service:  Completed, signed off Medicare Important Message given?   (If response is "NO", the following Medicare IM given date fields will be blank) Date Medicare IM given:   Medicare IM given by:   Date Additional Medicare IM given:   Additional Medicare IM given by:    Discharge Disposition:  HOME/SELF CARE  Per UR Regulation:  Reviewed for med. necessity/level of care/duration of stay

## 2014-09-23 NOTE — Progress Notes (Signed)
Pt presented with redness and swelling on his right eye,and trouble to open his right eye since he came back from surgery. Pt is on Acular eye drops 4 times today and seems not improving his discomfort. I called Dr. Ivin Bootyrews per Dr. Yevette Edwardsumonski PA request. Elita QuickPam from OR who answered the call sates someone from the anesthesia group will follow up with pt. Will continue to monitor pt closely.

## 2014-09-23 NOTE — Evaluation (Signed)
Physical Therapy Evaluation Patient Details Name: Ruben Atkinson MRN: [id number] DOB: 11/16/1970 Today's Date: 09/23/2014   History of Present Illness  44 y.o. male s/p Left-sided L4-5, L5-S1 transforaminal lumbar interbody fusion  Clinical Impression  Patient is s/p above surgery presenting with functional limitations due to the deficits listed below (see PT Problem List). Reviewed back precautions, donning of TLSO, and patient ambulated up to 275 feet this AM at a supervision level. Safely completed stair training. Required min assist for safe bed mobility and donning of brace but will have 24 hour supervision from family. Feel he is adequate for d/c from a mobility standpoint but will continue to follow and progress functional independence until d/c. Patient will benefit from skilled PT to increase their independence and safety with mobility to allow discharge to the venue listed below.       Follow Up Recommendations No PT follow up    Equipment Recommendations  None recommended by PT    Recommendations for Other Services OT consult     Precautions / Restrictions Precautions Precautions: Back Precaution Booklet Issued: Yes (comment) Precaution Comments: reviewed Required Braces or Orthoses: Spinal Brace Spinal Brace: Thoracolumbosacral orthotic;Applied in sitting position;Applied in standing position Restrictions Weight Bearing Restrictions: No      Mobility  Bed Mobility Overal bed mobility: Needs Assistance Bed Mobility: Rolling;Sidelying to Sit Rolling: Supervision Sidelying to sit: Min assist       General bed mobility comments: Educated on log roll technique. Practiced rolling towards his right side, using rail for support. Min assist for truncal support into seated position. Discussed sit to sidelying transition, but declined to practice at this time.  Transfers Overall transfer level: Needs assistance Equipment used: Rolling walker (2 wheeled) Transfers: Sit  to/from Stand Sit to Stand: Min guard;From elevated surface         General transfer comment: Close guard for safety from slightly elevated bed position similar to bed at home. Cues for hand placement and to not push through RW until standing.  Ambulation/Gait Ambulation/Gait assistance: Supervision Ambulation Distance (Feet): 275 Feet Assistive device: Rolling walker (2 wheeled) Gait Pattern/deviations: Step-through pattern;Decreased step length - left;Decreased stride length (Lt hip flexion limited by TLSO brace) Gait velocity: decreased Gait velocity interpretation: Below normal speed for age/gender General Gait Details: Educated on safe DME use with a rolling walker. No buckling noted. Encouraged to only lightly place hands on RW. VC for walker control at times.  Stairs Stairs: Yes Stairs assistance: Supervision Stair Management: One rail Left;Step to pattern;Sideways Number of Stairs: 5 General stair comments: Educated patient on safe stair navigation technique. VC for sequencing and to maintain back precautions. States he feels confident with this task.  Wheelchair Mobility    Modified Rankin (Stroke Patients Only)       Balance Overall balance assessment: Needs assistance Sitting-balance support: No upper extremity supported;Feet supported Sitting balance-Leahy Scale: Fair     Standing balance support: No upper extremity supported Standing balance-Leahy Scale: Fair                               Pertinent Vitals/Pain Pain Assessment: 0-10 Pain Score: 3  Pain Location: back Pain Descriptors / Indicators: Sore Pain Intervention(s): Monitored during session;Repositioned  SpO2 94% on room air    Home Living Family/patient expects to be discharged to:: Private residence Living Arrangements: Spouse/significant other Available Help at Discharge: Family;Available 24 hours/day Type of Home: House Home Access: Stairs to  enter Entrance Stairs-Rails:  Right Entrance Stairs-Number of Steps: 7 Home Layout: One level Home Equipment: Walker - 2 wheels;Bedside commode;Shower seat      Prior Function Level of Independence: Independent               Hand Dominance   Dominant Hand: Right    Extremity/Trunk Assessment   Upper Extremity Assessment: Defer to OT evaluation           Lower Extremity Assessment: Overall WFL for tasks assessed         Communication   Communication: No difficulties  Cognition Arousal/Alertness: Awake/alert Behavior During Therapy: WFL for tasks assessed/performed Overall Cognitive Status: Within Functional Limits for tasks assessed                      General Comments General comments (skin integrity, edema, etc.): Rt eye very red and watery. Practiced donning/doffing TLSO, placed in sitting, adjusted in standing. Discussed safety with mobility and back precautions.    Exercises General Exercises - Lower Extremity Ankle Circles/Pumps: AROM;Both;5 reps;Seated      Assessment/Plan    PT Assessment Patient needs continued PT services  PT Diagnosis Difficulty walking;Abnormality of gait;Acute pain   PT Problem List Decreased range of motion;Decreased activity tolerance;Decreased balance;Decreased mobility;Decreased knowledge of use of DME;Decreased knowledge of precautions;Pain  PT Treatment Interventions DME instruction;Gait training;Stair training;Functional mobility training;Therapeutic activities;Therapeutic exercise;Balance training;Neuromuscular re-education;Patient/family education;Modalities   PT Goals (Current goals can be found in the Care Plan section) Acute Rehab PT Goals Patient Stated Goal: no pain PT Goal Formulation: With patient Time For Goal Achievement: 09/30/14 Potential to Achieve Goals: Good    Frequency Min 5X/week   Barriers to discharge        Co-evaluation               End of Session Equipment Utilized During Treatment: Back brace;Gait  belt Activity Tolerance: Patient tolerated treatment well;No increased pain Patient left: in chair;with call bell/phone within reach Nurse Communication: Mobility status         Time: 9604-5409 PT Time Calculation (min) (ACUTE ONLY): 50 min   Charges:   PT Evaluation $Initial PT Evaluation Tier I: 1 Procedure PT Treatments $Gait Training: 8-22 mins $Therapeutic Activity: 8-22 mins   PT G Codes:        Berton Mount 09/23/2014, 10:05 AM Charlsie Merles, PT [phone]

## 2014-09-24 MED ORDER — POLYMYXIN B-TRIMETHOPRIM 10000-0.1 UNIT/ML-% OP SOLN: 1.0000 [drp] | Freq: Three times a day (TID) | OPHTHALMIC | Status: AC

## 2014-09-24 MED ORDER — KETOROLAC TROMETHAMINE 0.5 % OP SOLN: 1.0000 [drp] | Freq: Three times a day (TID) | OPHTHALMIC | Status: AC | PRN

## 2014-09-24 NOTE — Discharge Summary (Signed)
Patient ID: Ruben Atkinson MRN: [id number] DOB/AGE: 42/08/1970 42 y.o.  Admit date: 09/22/2014 Discharge date: 09/24/2014  Admission Diagnoses:  Active Problems:   Radiculopathy   Discharge Diagnoses:  Same  Past Medical History  Diagnosis Date  . Complication of anesthesia     After having shoulder surgery patient stated "I was mad as soon as I woke up, lasted about 2-3 days"  . History of hiatal hernia     Surgeries: Procedure(s): POSTERIOR LUMBAR FUSION 2 LEVEL on 09/22/2014   Consultants:    Discharged Condition: Improved  Hospital Course: Ruben Atkinson is an 44 y.o. male who was admitted 09/22/2014 for operative treatment of<principal problem not specified>. Patient has severe unremitting pain that affects sleep, daily activities, and work/hobbies. After pre-op clearance the patient was taken to the operating room on 09/22/2014 and underwent  Procedure(s): POSTERIOR LUMBAR FUSION 2 LEVEL.    Patient was given perioperative antibiotics: Anti-infectives    Start     Dose/Rate Route Frequency Ordered Stop   09/22/14 1930  ceFAZolin (ANCEF) IVPB 1 g/50 mL premix     1 g 100 mL/hr over 30 Minutes Intravenous Every 8 hours 09/22/14 1656 09/23/14 0309   09/22/14 0600  ceFAZolin (ANCEF) IVPB 2 g/50 mL premix     2 g 100 mL/hr over 30 Minutes Intravenous On call to O.R. 09/21/14 1344 09/22/14 1139       Patient was given sequential compression devices, early ambulation, and chemoprophylaxis to prevent DVT.  Patient benefited maximally from hospital stay and there were no complications.    Recent vital signs: Patient Vitals for the past 24 hrs:  BP Temp Temp src Pulse Resp SpO2  09/23/14 2014 (!) 104/54 mmHg 99.8 F (37.7 C) Oral 88 - 96 %  09/23/14 1300 (!) 121/59 mmHg 99.7 F (37.6 C) - 93 18 98 %  09/23/14 1038 - - - - - 97 %     Recent laboratory studies: No results for input(s): WBC, HGB, HCT, PLT, NA, K, CL, CO2, BUN, CREATININE, GLUCOSE, INR, CALCIUM in the  last 72 hours.  Invalid input(s): PT, 2   Discharge Medications:     Medication List    TAKE these medications        CHELATED MULTIPLE MINERAL PO  Take 1 tablet by mouth 2 (two) times daily.     DHEA 25 MG Caps  Take 25 mg by mouth every morning.     ketorolac 0.5 % ophthalmic solution  Commonly known as:  ACULAR  Place 1 drop into the right eye every 8 (eight) hours as needed (eye pain).     L-Arginine 500 MG Caps  Take 1 capsule by mouth daily.     OVER THE COUNTER MEDICATION  Take 1 tablet by mouth 2 (two) times daily. "Tribulus Vitamin"     trimethoprim-polymyxin b ophthalmic solution  Commonly known as:  POLYTRIM  Place 1 drop into the right eye every 8 (eight) hours.     VITAMIN B COMPLEX PO  Take 1 tablet by mouth daily.     VITAMIN C PO  Take 1 tablet by mouth 2 (two) times daily.        Diagnostic Studies: Dg Chest 2 View  09/15/2014   CLINICAL DATA:  For lumbar spine fusion, smoking history  EXAM: CHEST  2 VIEW  COMPARISON:  None.  FINDINGS: No active infiltrate or effusion is seen. Mediastinal and hilar contours are unremarkable. The heart is within normal limits in size. No bony  abnormality is seen.  IMPRESSION: No active cardiopulmonary disease.   Electronically Signed   By: Dwyane Dee M.D.   On: 09/15/2014 10:35   Dg Lumbar Spine 2-3 Views  09/22/2014   CLINICAL DATA:  PLIF.  EXAM: LUMBAR SPINE - 2-3 VIEW; DG C-ARM GT 120 MIN  COMPARISON:  09/22/2014  FLUOROSCOPY TIME:  1 minutes 9 seconds  FINDINGS: Two intraoperative images of the lumbosacral spine. Posterior fusion hardware is intact from L4-S1. Prosthetic disc material at the L4-5 and L5-S1 levels. Mild a moderate spondylosis is present. There is stable minimal grade 1 anterolisthesis of L4 on L5. Remainder the exam is unchanged.  IMPRESSION: Posterior fusion from L4-S1 with hardware intact.   Electronically Signed   By: Elberta Fortis M.D.   On: 09/22/2014 14:37   Dg Lumbar Spine 2-3 Views  09/22/2014    CLINICAL DATA:  Intraoperative localization for spine surgery.  EXAM: LUMBAR SPINE - 2-3 VIEW  COMPARISON:  None.  FINDINGS: The first film demonstrates 2 spinal needles marking the S2 and S3 levels.  The second film demonstrates spinal needles marking L5 and S2.  IMPRESSION: Intraoperative localization as above.   Electronically Signed   By: Rudie Meyer M.D.   On: 09/22/2014 10:41   Dg C-arm Gt 120 Min  09/22/2014   CLINICAL DATA:  PLIF.  EXAM: LUMBAR SPINE - 2-3 VIEW; DG C-ARM GT 120 MIN  COMPARISON:  09/22/2014  FLUOROSCOPY TIME:  1 minutes 9 seconds  FINDINGS: Two intraoperative images of the lumbosacral spine. Posterior fusion hardware is intact from L4-S1. Prosthetic disc material at the L4-5 and L5-S1 levels. Mild a moderate spondylosis is present. There is stable minimal grade 1 anterolisthesis of L4 on L5. Remainder the exam is unchanged.  IMPRESSION: Posterior fusion from L4-S1 with hardware intact.   Electronically Signed   By: Elberta Fortis M.D.   On: 09/22/2014 14:37   Mr Outside Films Spine  09/22/2014   This examination belongs to an outside facility and is stored  here for comparison purposes only.  Contact the originating outside  institution for any associated report or interpretation.   Disposition: Final discharge disposition not confirmed      Discharge Instructions    Call MD / Call 911    Complete by:  As directed   If you experience chest pain or shortness of breath, CALL 911 and be transported to the hospital emergency room.  If you develope a fever above 101 F, pus (white drainage) or increased drainage or redness at the wound, or calf pain, call your surgeon's office.     Constipation Prevention    Complete by:  As directed   Drink plenty of fluids.  Prune juice may be helpful.  You may use a stool softener, such as Colace (over the counter) 100 mg twice a day.  Use MiraLax (over the counter) for constipation as needed.     Diet - low sodium heart healthy     Complete by:  As directed      Increase activity slowly as tolerated    Complete by:  As directed               Signed: Emani Morad, Ginger Organ 09/24/2014, 8:04 AM

## 2014-09-24 NOTE — Progress Notes (Signed)
Subjective: 2 Days Post-Op Procedure(s) (LRB): POSTERIOR LUMBAR FUSION 2 LEVEL (Left)   Patient states that his eye pain is slowly improving. He mentions that he still has some sharp pains in his leg which he had prior to surgery but it also seems to slowly be improving and not constant. He is ready to go home and is asking when he can leave.   Activity level:  wbat in brace Diet tolerance:  Eating well Voiding:  ok Patient reports pain as mild and moderate.    Objective: Vital signs in last 24 hours: Temp:  [99.7 F (37.6 C)-99.8 F (37.7 C)] 99.8 F (37.7 C) (04/15 2014) Pulse Rate:  [88-93] 88 (04/15 2014) Resp:  [18] 18 (04/15 1300) BP: (104-121)/(54-59) 104/54 mmHg (04/15 2014) SpO2:  [96 %-98 %] 96 % (04/15 2014)  Labs: No results for input(s): HGB in the last 72 hours. No results for input(s): WBC, RBC, HCT, PLT in the last 72 hours. No results for input(s): NA, K, CL, CO2, BUN, CREATININE, GLUCOSE, CALCIUM in the last 72 hours. No results for input(s): LABPT, INR in the last 72 hours.  Physical Exam:  Neurologically intact ABD soft Neurovascular intact Sensation intact distally Intact pulses distally Dorsiflexion/Plantar flexion intact Incision: dressing C/D/I and no drainage No cellulitis present Compartment soft  Assessment/Plan:  2 Days Post-Op Procedure(s) (LRB): POSTERIOR LUMBAR FUSION 2 LEVEL (Left) Advance diet Up with therapy Discharge home with home health after PT this morning. Rx's on chart. Must wear brace whenever out of bed. Follow up with Dr. Yevette Atkinson as scheduled.   Ruben Atkinson, Ruben OrganNDREW Atkinson 09/24/2014, 7:28 AM

## 2015-09-28 IMAGING — RF DG LUMBAR SPINE 2-3V
1 series · 2 of 2 positions shown · non-contrast
Comparison: 09/22/2014

FLUOROSCOPY TIME:  1 minutes 9 seconds

CLINICAL DATA: PLIF.

EXAM:
LUMBAR SPINE - 2-3 VIEW; DG C-ARM GT 120 MIN

[Series 1: run · 2 of 2 slices shown]
[im 1/2]
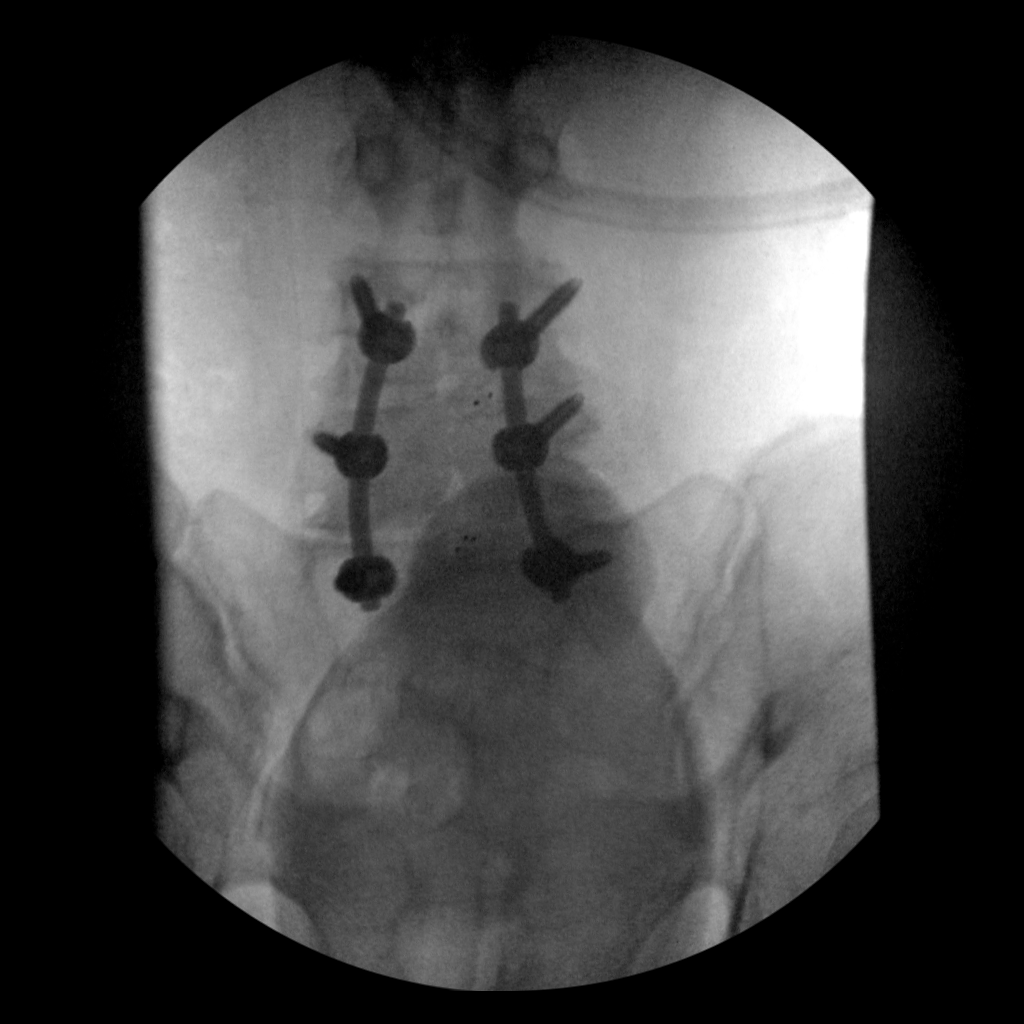
[im 2/2]
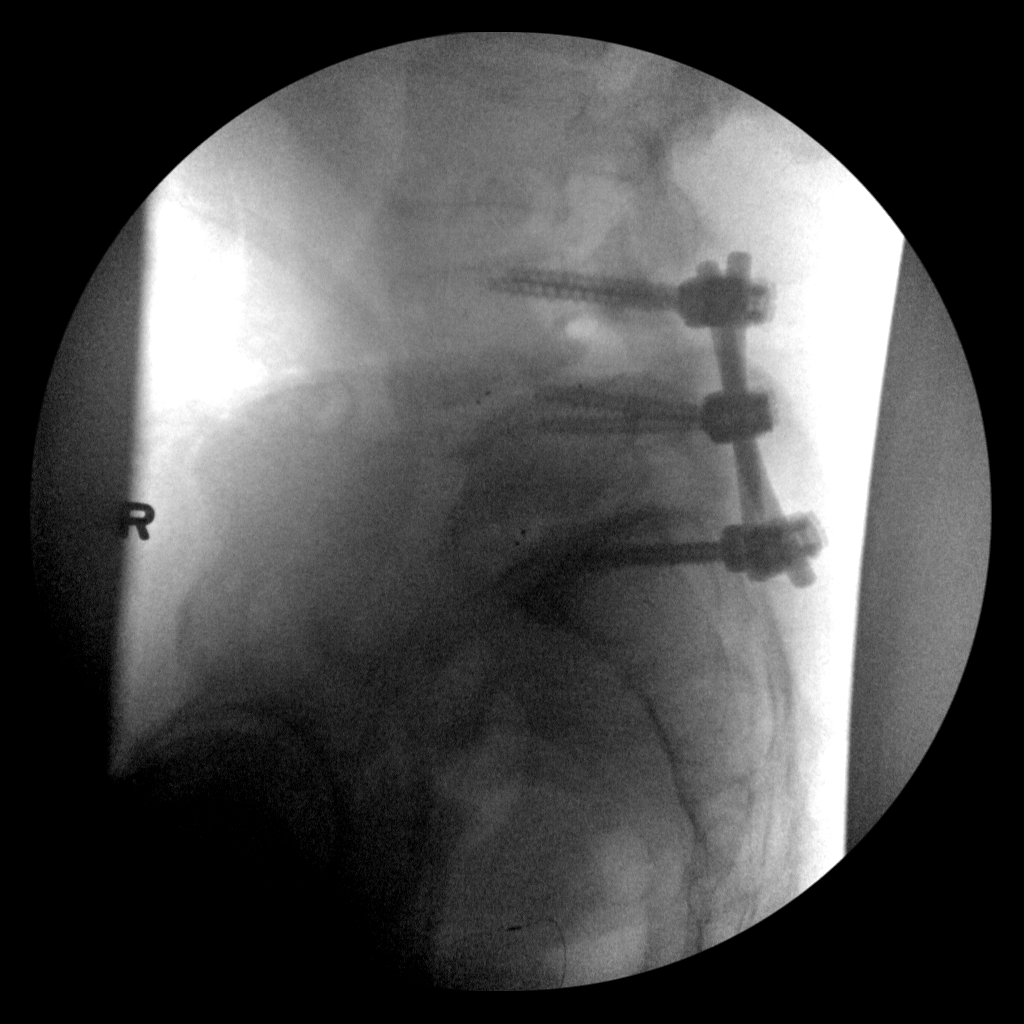

[2 of 2 positions shown; findings below may reference images not displayed]

FINDINGS: Two intraoperative images of the lumbosacral spine. Posterior fusion
hardware is intact from L4-S1. Prosthetic disc material at the L4-5
and L5-S1 levels. Mild a moderate spondylosis is present. There is
stable minimal grade 1 anterolisthesis of L4 on L5. Remainder the
exam is unchanged.
IMPRESSION: Posterior fusion from L4-S1 with hardware intact.
